# Patient Record
Sex: Male | Born: 2005
Health system: Southern US, Community
[De-identification: ages and names within clinical notes are randomized; demographics above are authoritative.]

## PROBLEM LIST (undated history)

## (undated) DIAGNOSIS — J309 Allergic rhinitis, unspecified: Secondary | ICD-10-CM

## (undated) HISTORY — PX: TONSILLECTOMY: SUR1361

## (undated) HISTORY — DX: Allergic rhinitis, unspecified: J30.9

---
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
## 2023-02-24 ENCOUNTER — Other Ambulatory Visit: Payer: Self-pay

## 2023-02-24 ENCOUNTER — Emergency Department (HOSPITAL_BASED_OUTPATIENT_CLINIC_OR_DEPARTMENT_OTHER)
Admission: EM | Admit: 2023-02-24 | Discharge: 2023-02-24 | Disposition: A | Discharge status: Home / Self Care | Payer: BC Managed Care – PPO | Attending: Emergency Medicine | Admitting: Emergency Medicine

## 2023-02-24 ENCOUNTER — Encounter (HOSPITAL_BASED_OUTPATIENT_CLINIC_OR_DEPARTMENT_OTHER): Payer: Self-pay | Admitting: Emergency Medicine

## 2023-02-24 DIAGNOSIS — R112 Nausea with vomiting, unspecified: Secondary | ICD-10-CM | POA: Insufficient documentation

## 2023-02-24 DIAGNOSIS — F1290 Cannabis use, unspecified, uncomplicated: Secondary | ICD-10-CM | POA: Insufficient documentation

## 2023-02-24 DIAGNOSIS — D72829 Elevated white blood cell count, unspecified: Secondary | ICD-10-CM | POA: Insufficient documentation

## 2023-02-24 DIAGNOSIS — Z1152 Encounter for screening for COVID-19: Secondary | ICD-10-CM | POA: Insufficient documentation

## 2023-02-24 DIAGNOSIS — R1013 Epigastric pain: Secondary | ICD-10-CM | POA: Insufficient documentation

## 2023-02-24 LAB — RESP PANEL BY RT-PCR (RSV, FLU A&B, COVID)  RVPGX2
Influenza A by PCR: NEGATIVE
Influenza B by PCR: NEGATIVE
Resp Syncytial Virus by PCR: NEGATIVE
SARS Coronavirus 2 by RT PCR: NEGATIVE

## 2023-02-24 LAB — URINALYSIS, ROUTINE W REFLEX MICROSCOPIC
Bacteria, UA: NONE SEEN
Bilirubin Urine: NEGATIVE
Glucose, UA: 100 mg/dL — AB
Hgb urine dipstick: NEGATIVE
Ketones, ur: >80 mg/dL — AB
Leukocytes,Ua: NEGATIVE
Nitrite: NEGATIVE
Protein, ur: 30 mg/dL — AB
Specific Gravity, Urine: 1.039 — ABNORMAL HIGH (ref 1.005–1.030)
pH: 8.5 — ABNORMAL HIGH (ref 5.0–8.0)

## 2023-02-24 LAB — COMPREHENSIVE METABOLIC PANEL
ALT: 21 U/L (ref 0–44)
AST: 22 U/L (ref 15–41)
Albumin: 5.3 g/dL — ABNORMAL HIGH (ref 3.5–5.0)
Alkaline Phosphatase: 62 U/L (ref 38–126)
Anion gap: 10 (ref 5–15)
BUN: 14 mg/dL (ref 6–20)
CO2: 24 mmol/L (ref 22–32)
Calcium: 10.6 mg/dL — ABNORMAL HIGH (ref 8.9–10.3)
Chloride: 103 mmol/L (ref 98–111)
Creatinine, Ser: 0.99 mg/dL (ref 0.61–1.24)
GFR, Estimated: >60 mL/min (ref >60)
Glucose, Bld: 179 mg/dL — ABNORMAL HIGH (ref 70–99)
Potassium: 3.9 mmol/L (ref 3.5–5.1)
Sodium: 137 mmol/L (ref 135–145)
Total Bilirubin: 1.5 mg/dL — ABNORMAL HIGH (ref 0.3–1.2)
Total Protein: 8.6 g/dL — ABNORMAL HIGH (ref 6.5–8.1)

## 2023-02-24 LAB — CBC
HCT: 43.5 % (ref 39.0–52.0)
Hemoglobin: 15.4 g/dL (ref 13.0–17.0)
MCH: 31.2 pg (ref 26.0–34.0)
MCHC: 35.4 g/dL (ref 30.0–36.0)
MCV: 88.1 fL (ref 80.0–100.0)
Platelets: 207 10*3/uL (ref 150–400)
RBC: 4.94 MIL/uL (ref 4.22–5.81)
RDW: 11.9 % (ref 11.5–15.5)
WBC: 18.8 10*3/uL — ABNORMAL HIGH (ref 4.0–10.5)
nRBC: 0 % (ref 0.0–0.2)

## 2023-02-24 LAB — MAGNESIUM: Magnesium: 1.6 mg/dL — ABNORMAL LOW (ref 1.7–2.4)

## 2023-02-24 LAB — LIPASE, BLOOD: Lipase: 13 U/L (ref 11–51)

## 2023-02-24 MED ORDER — ONDANSETRON 4 MG PO TBDP: 4.0000 mg | ORAL_TABLET | Freq: Three times a day (TID) | ORAL | 0 refills | Status: AC | PRN

## 2023-02-24 MED ORDER — DICYCLOMINE HCL 20 MG PO TABS: 20.0000 mg | ORAL_TABLET | Freq: Two times a day (BID) | ORAL | 0 refills | Status: AC | PRN

## 2023-02-24 MED ORDER — ALUM & MAG HYDROXIDE-SIMETH 200-200-20 MG/5ML PO SUSP
15.0000 mL | Freq: Once | ORAL | Status: AC
  Administered 2023-02-24: 15 mL via ORAL
  Filled 2023-02-24: qty 30

## 2023-02-24 MED ORDER — DROPERIDOL 2.5 MG/ML IJ SOLN
1.2500 mg | Freq: Once | INTRAMUSCULAR | Status: AC
  Administered 2023-02-24: 1.25 mg via INTRAVENOUS
  Filled 2023-02-24: qty 2

## 2023-02-24 MED ORDER — FAMOTIDINE 20 MG PO TABS: 20.0000 mg | ORAL_TABLET | Freq: Every day | ORAL | 0 refills | Status: AC | PRN

## 2023-02-24 MED ORDER — DICYCLOMINE HCL 10 MG PO CAPS
10.0000 mg | ORAL_CAPSULE | Freq: Once | ORAL | Status: AC
  Administered 2023-02-24: 10 mg via ORAL
  Filled 2023-02-24: qty 1

## 2023-02-24 MED ORDER — FAMOTIDINE 20 MG PO TABS
20.0000 mg | ORAL_TABLET | Freq: Once | ORAL | Status: AC
  Administered 2023-02-24: 20 mg via ORAL
  Filled 2023-02-24: qty 1

## 2023-02-24 MED ORDER — ONDANSETRON 4 MG PO TBDP: 8.0000 mg | ORAL_TABLET | Freq: Once | ORAL | Status: DC

## 2023-02-24 NOTE — ED Notes (Signed)
Patient given ginger ale to drink 

## 2023-02-24 NOTE — ED Notes (Signed)
Spoke with lab to add on mag. 

## 2023-02-24 NOTE — ED Notes (Signed)
Pt given ginger ale.

## 2023-02-24 NOTE — ED Provider Notes (Signed)
[place] EMERGENCY DEPARTMENT AT [facility] Provider Note   CSN: [id number] Arrival date & time: 02/24/23  1218     History  Chief Complaint  Patient presents with   Abdominal Pain    Jerry Potter Pain is a 17 y.o. male.  HPI   17 year old male presents emergency department with complaints of upper middle abdominal pain, nausea and vomiting.  Patient reports history of similar symptoms in the past prompting ED visits.  States he was told he was related to his cannabis use.  Reports most recent cannabis use 3 days ago but does report regular use despite ED/hospital visits.  Reports symptoms beginning earlier this morning.  Did have at home Phenergan suppository which helped with his nausea and emesis but still describes cramping abdominal pain.  Denies any fever, chills, chest pain, shortness of breath, urinary symptoms, change in bowel habits, hematemesis.  Past medical history significant for hyperemesis cannabinoid syndrome,  Home Medications Prior to Admission medications   Medication Sig Start Date End Date Taking? Authorizing Provider  dicyclomine (BENTYL) 20 MG tablet Take 1 tablet (20 mg total) by mouth 2 (two) times daily as needed for spasms. 02/24/23  Yes Sherian Maroon A, PA  famotidine (PEPCID) 20 MG tablet Take 1 tablet (20 mg total) by mouth daily as needed for heartburn or indigestion. 02/24/23  Yes Sherian Maroon A, PA  ondansetron (ZOFRAN-ODT) 4 MG disintegrating tablet Take 1 tablet (4 mg total) by mouth every 8 (eight) hours as needed. 02/24/23  Yes Sherian Maroon A, PA  ibuprofen (ADVIL) 200 MG tablet Take 800 mg by mouth every 6 (six) hours as needed for mild pain, fever or headache.    [provider]  omeprazole (PRILOSEC) 40 MG capsule TAKE 1 CAPSULE (40 MG TOTAL) BY MOUTH DAILY. 09/25/21 03/24/22  Lanelle Bal, DO  promethazine (PHENERGAN) 25 MG suppository Place 1 suppository (25 mg total) rectally every 6 (six) hours as needed for  nausea or vomiting. 08/06/22   Garlon Hatchet, PA-C      Allergies    Patient has no known allergies.    Review of Systems   Review of Systems  All other systems reviewed and are negative.   Physical Exam Updated Vital Signs BP 115/74   Pulse (!) 54   Temp 97.8 F (36.6 C) (Oral)   Resp 16   Wt 86.2 kg   SpO2 99%   BMI 27.26 kg/m  Physical Exam Vitals and nursing note reviewed.  Constitutional:      General: He is not in acute distress.    Appearance: He is well-developed.  HENT:     Head: Normocephalic and atraumatic.  Eyes:     Conjunctiva/sclera: Conjunctivae normal.  Cardiovascular:     Rate and Rhythm: Normal rate and regular rhythm.     Heart sounds: No murmur heard. Pulmonary:     Effort: Pulmonary effort is normal. No respiratory distress.     Breath sounds: Normal breath sounds.  Abdominal:     Palpations: Abdomen is soft.     Tenderness: There is abdominal tenderness. There is no right CVA tenderness, left CVA tenderness or guarding.     Comments: Mild epigastric tenderness to deep palpation.  Musculoskeletal:        General: No swelling.     Cervical back: Neck supple.  Skin:    General: Skin is warm and dry.     Capillary Refill: Capillary refill takes less than 2 seconds.  Neurological:  Mental Status: He is alert.  Psychiatric:        Mood and Affect: Mood normal.     ED Results / Procedures / Treatments   Labs (all labs ordered are listed, but only abnormal results are displayed) Labs Reviewed  COMPREHENSIVE METABOLIC PANEL - Abnormal; Notable for the following components:      Result Value   Glucose, Bld 179 (*)    Calcium 10.6 (*)    Total Protein 8.6 (*)    Albumin 5.3 (*)    Total Bilirubin 1.5 (*)    All other components within normal limits  URINALYSIS, ROUTINE W REFLEX MICROSCOPIC - Abnormal; Notable for the following components:   Specific Gravity, Urine 1.039 (*)    pH 8.5 (*)    Glucose, UA 100 (*)    Ketones, ur >80 (*)     Protein, ur 30 (*)    All other components within normal limits  CBC - Abnormal; Notable for the following components:   WBC 18.8 (*)    All other components within normal limits  MAGNESIUM - Abnormal; Notable for the following components:   Magnesium 1.6 (*)    All other components within normal limits  RESP PANEL BY RT-PCR (RSV, FLU A&B, COVID)  RVPGX2  LIPASE, BLOOD    EKG None  Radiology No results found.  Procedures Procedures    Medications Ordered in ED Medications  droperidol (INAPSINE) 2.5 MG/ML injection 1.25 mg (1.25 mg Intravenous Given 02/24/23 1419)  famotidine (PEPCID) tablet 20 mg (20 mg Oral Given 02/24/23 1417)  alum & mag hydroxide-simeth (MAALOX/MYLANTA) 200-200-20 MG/5ML suspension 15 mL (15 mLs Oral Given 02/24/23 1419)  dicyclomine (BENTYL) capsule 10 mg (10 mg Oral Given 02/24/23 1417)    ED Course/ Medical Decision Making/ A&P                                 Medical Decision Making Amount and/or Complexity of Data Reviewed Labs: ordered.  Risk OTC drugs. Prescription drug management.   This patient presents to the ED for concern of an, pain, nausea, vomiting, this involves an extensive number of treatment options, and is a complaint that carries with it a high risk of complications and morbidity.  The differential diagnosis includes hyperemesis cannabinoid syndrome, gastritis, PUD, CBD pathology, cholecystitis, SBO/LBO, volvulus, diverticulitis, appendicitis, pancreatitis, plantar fasciitis, nephrolithiasis, cystitis, gas enteritis, other   Co morbidities that complicate the patient evaluation  See HPI   Additional history obtained:  Additional history obtained from EMR External records from outside source obtained and reviewed including hospital records   Lab Tests:  I Ordered, and personally interpreted labs.  The pertinent results include: Leukocytosis of 18.8.  No evidence of anemia.  Placed within range.  Hypercalcemia 10.6  but otherwise, electrolytes within normal limits.  No transaminitis.  No renal dysfunction.  UA significant for greater than 80 ketones, 30 proteins, glucose present.  But otherwise unremarkable.  Respiratory viral panel negative.   Imaging Studies ordered:  N/a   Cardiac Monitoring: / EKG:  The patient was maintained on a cardiac monitor.  I personally viewed and interpreted the cardiac monitored which showed an underlying rhythm of: Sinus rhythm   Consultations Obtained:  N/a   Problem List / ED Course / Critical interventions / Medication management  Epigastric abdominal pain, nausea, vomiting, marijuana use I ordered medication including Bentyl, Pepcid, Maalox, droperidol   Reevaluation of the patient after  these medicines showed that the patient improved I have reviewed the patients home medicines and have made adjustments as needed   Social Determinants of Health:  Chronic marijuana use.  Cigarette use.  Denies alcohol or other illicit substance use.   Test / Admission - Considered:  Epigastric abdominal pain, nausea, vomiting, marijuana use Vitals signs within normal range and stable throughout visit. Laboratory studies significant for: See above 17 year old male presents emergency department with complaints of epigastric abdominal pain, nausea, vomiting.  Patient with continued marijuana use but has multiple presentations similarly in the past associated with marijuana use.  On exam, patient initially with epigastric tenderness to palpation otherwise, benign abdominal exam.  Labs concerning for leukocytosis of 18.8 as well as hypomagnesia otherwise, reassuring.  Patient treated with medications as above in the ED and noted resolution of symptoms.  Repeat abdominal exams showed no tenderness at all.  Given leukocytosis in the setting of acute abdominal pain associate with nausea and vomiting, CT imaging was offered the patient declined due to how well he was feeling.  Will  send home with antiemetic and recommend symptomatic therapy as described in AVS.  Close follow-up with primary care recommended for reevaluation.  Treatment plan discussed at length with patient and he acknowledged understand was agreeable to said plan.  Patient overall well-appearing, afebrile in no acute distress. Worrisome signs and symptoms were discussed with the patient, and the patient acknowledged understanding to return to the ED if noticed. Patient was stable upon discharge.          Final Clinical Impression(s) / ED Diagnoses Final diagnoses:  Epigastric abdominal pain  Nausea and vomiting, unspecified vomiting type  Marijuana use    Rx / DC Orders ED Discharge Orders          Ordered    ondansetron (ZOFRAN-ODT) 4 MG disintegrating tablet  Every 8 hours PRN        02/24/23 1532    dicyclomine (BENTYL) 20 MG tablet  2 times daily PRN        02/24/23 1532    famotidine (PEPCID) 20 MG tablet  Daily PRN        02/24/23 1532    Ambulatory referral to Gastroenterology        02/24/23 1546              Peter Garter, PA 02/24/23 1703    Blane Ohara, MD 03/01/23 [phone]

## 2023-02-24 NOTE — ED Triage Notes (Signed)
This am started with n/v. Pt has had frequent visits for this . Dx with cannabis induced cyclic vomiting. Pt states he does daily marijuana, states this is not the problem. No fevers. Not able to keep fluids down today. Was able to keep them down yesterday.

## 2023-02-24 NOTE — Discharge Instructions (Addendum)
As discussed, workup today overall reassuring.  Your electrolytes were grossly normal but did have low magnesium which we will give you several days worth.  Will send you home with nausea and vomiting medicine to use as needed.  Recommend bland diet over the next few days until you are able to tolerate more complex foods.  Will recommend stop using marijuana as this could be causing her symptoms.  Please do not hesitate to return to emergency department for worrisome signs and symptoms we discussed become apparent.

## 2023-03-10 ENCOUNTER — Encounter: Payer: Self-pay | Admitting: Internal Medicine
